# Patient Record
Sex: Female | Born: 1990 | Race: Black or African American | Hispanic: No | Marital: Married | Smoking: Current every day smoker
Health system: Southern US, Community
[De-identification: ages and names within clinical notes are randomized; demographics above are authoritative.]

---

## 2009-07-14 ENCOUNTER — Emergency Department: Payer: Self-pay | Admitting: Emergency Medicine

## 2013-03-23 ENCOUNTER — Emergency Department: Payer: Self-pay | Admitting: Emergency Medicine

## 2013-03-23 LAB — COMPREHENSIVE METABOLIC PANEL
Albumin: 3.7 g/dL (ref 3.4–5.0)
Anion Gap: 5 — ABNORMAL LOW (ref 7–16)
Bilirubin,Total: 0.5 mg/dL (ref 0.2–1.0)
Calcium, Total: 8.6 mg/dL (ref 8.5–10.1)
Creatinine: 0.75 mg/dL (ref 0.60–1.30)
EGFR (Non-African Amer.): >60
Osmolality: 276 (ref 275–301)
Potassium: 3.2 mmol/L — ABNORMAL LOW (ref 3.5–5.1)
SGOT(AST): 21 U/L (ref 15–37)
SGPT (ALT): 13 U/L (ref 12–78)

## 2013-03-23 LAB — URINALYSIS, COMPLETE
Bacteria: NONE SEEN
Bilirubin,UR: NEGATIVE
Blood: NEGATIVE
Glucose,UR: NEGATIVE mg/dL (ref 0–75)
Leukocyte Esterase: NEGATIVE
Nitrite: NEGATIVE
Ph: 6 (ref 4.5–8.0)
RBC,UR: 1 /HPF (ref 0–5)
Specific Gravity: 1.02 (ref 1.003–1.030)

## 2013-03-23 LAB — WET PREP, GENITAL

## 2013-03-23 LAB — CBC
MCV: 90 fL (ref 80–100)
Platelet: 172 10*3/uL (ref 150–440)
RBC: 3.94 10*6/uL (ref 3.80–5.20)
WBC: 6.7 10*3/uL (ref 3.6–11.0)

## 2013-03-23 LAB — LIPASE, BLOOD: Lipase: 74 U/L (ref 73–393)

## 2017-07-08 ENCOUNTER — Other Ambulatory Visit: Payer: Self-pay

## 2017-07-08 ENCOUNTER — Ambulatory Visit
Admission: EM | Admit: 2017-07-08 | Discharge: 2017-07-08 | Disposition: A | Discharge status: Home / Self Care | Payer: BLUE CROSS/BLUE SHIELD | Attending: Family Medicine | Admitting: Family Medicine

## 2017-07-08 ENCOUNTER — Encounter: Payer: Self-pay | Admitting: Emergency Medicine

## 2017-07-08 DIAGNOSIS — R059 Cough, unspecified: Secondary | ICD-10-CM

## 2017-07-08 DIAGNOSIS — R69 Illness, unspecified: Secondary | ICD-10-CM | POA: Diagnosis not present

## 2017-07-08 DIAGNOSIS — R05 Cough: Secondary | ICD-10-CM

## 2017-07-08 DIAGNOSIS — R51 Headache: Secondary | ICD-10-CM

## 2017-07-08 DIAGNOSIS — J029 Acute pharyngitis, unspecified: Secondary | ICD-10-CM

## 2017-07-08 DIAGNOSIS — J111 Influenza due to unidentified influenza virus with other respiratory manifestations: Secondary | ICD-10-CM

## 2017-07-08 LAB — RAPID STREP SCREEN (MED CTR MEBANE ONLY): Streptococcus, Group A Screen (Direct): NEGATIVE

## 2017-07-08 MED ORDER — OSELTAMIVIR PHOSPHATE 75 MG PO CAPS: 75.0000 mg | ORAL_CAPSULE | Freq: Two times a day (BID) | ORAL | 0 refills | Status: AC

## 2017-07-08 NOTE — Discharge Instructions (Addendum)
Recommend start Tamiflu 75mg  twice a day as directed. May continue OTC cold and flu medications as needed for symptoms. May also take OTC Delsym as directed for cough. Rest. Increase fluid intake to help loosen up mucus. Follow-up in 3 to 4 days here if not improving.

## 2017-07-08 NOTE — ED Provider Notes (Signed)
[facility]    CSN: [id number] Arrival date & time: 07/08/17  1121     History   Chief Complaint Chief Complaint  Patient presents with  . Sore Throat    HPI Ashlee Gonzales is a 27 y.o. female.   27 year old female presents with sore throat, cough, body aches and fatigue that started yesterday. Also having some nausea but denies any nasal congestion, vomiting or diarrhea. Has felt warm but no documented fever at home. She has tried Catering manager with some relief.  Daughter had similar symptoms and dx with the flu last week and took Tamiflu and felt much better. No other chronic health issues. Takes no daily medication.    The history is provided by the patient.    History reviewed. No pertinent past medical history.  There are no active problems to display for this patient.   History reviewed. No pertinent surgical history.  OB History    No data available       Home Medications    Prior to Admission medications   Medication Sig Start Date End Date Taking? Authorizing Provider  levonorgestrel (MIRENA) 20 MCG/24HR IUD 1 each by Intrauterine route once.   Yes [provider]  oseltamivir (TAMIFLU) 75 MG capsule Take 1 capsule (75 mg total) by mouth 2 (two) times daily for 5 days. 07/08/17 07/13/17  Sudie Grumbling, NP    Family History History reviewed. No pertinent family history.  Social History Social History   Tobacco Use  . Smoking status: Current Every Day Smoker    Types: E-cigarettes  . Smokeless tobacco: Never Used  Substance Use Topics  . Alcohol use: Yes  . Drug use: Not on file     Allergies   Patient has no known allergies.   Review of Systems Review of Systems  Constitutional: Positive for appetite change, chills and fatigue.  HENT: Positive for postnasal drip and sore throat. Negative for ear discharge, ear pain, facial swelling, mouth sores, nosebleeds, sinus pressure, sinus pain and sneezing.   Eyes:  Negative for pain, discharge, redness and itching.  Respiratory: Positive for cough. Negative for chest tightness, shortness of breath and wheezing.   Gastrointestinal: Positive for nausea. Negative for abdominal pain, diarrhea and vomiting.  Musculoskeletal: Positive for arthralgias and myalgias. Negative for neck pain and neck stiffness.  Skin: Negative for rash and wound.  Neurological: Positive for headaches. Negative for dizziness, tremors, seizures, syncope, weakness, light-headedness and numbness.  Hematological: Negative for adenopathy. Does not bruise/bleed easily.     Physical Exam Triage Vital Signs ED Triage Vitals  Enc Vitals Group     BP 07/08/17 1149 (!) 99/50     Pulse Rate 07/08/17 1149 72     Resp 07/08/17 1149 14     Temp 07/08/17 1149 98.6 F (37 C)     Temp Source 07/08/17 1149 Oral     SpO2 07/08/17 1149 100 %     Weight 07/08/17 1147 115 lb (52.2 kg)     Height 07/08/17 1147 5\' 3"  (1.6 m)     Head Circumference --      Peak Flow --      Pain Score 07/08/17 1147 6     Pain Loc --      Pain Edu? --      Excl. in GC? --    No data found.  Updated Vital Signs BP (!) 99/50 (BP Location: Left Arm)   Pulse 72   Temp  98.6 F (37 C) (Oral)   Resp 14   Ht 5\' 3"  (1.6 m)   Wt 115 lb (52.2 kg)   SpO2 100%   BMI 20.37 kg/m   Visual Acuity Right Eye Distance:   Left Eye Distance:   Bilateral Distance:    Right Eye Near:   Left Eye Near:    Bilateral Near:     Physical Exam  Constitutional: She is oriented to person, place, and time. She appears well-developed and well-nourished. She appears ill. No distress.  HENT:  Head: Normocephalic and atraumatic.  Right Ear: Hearing, tympanic membrane, external ear and ear canal normal.  Left Ear: Hearing, tympanic membrane, external ear and ear canal normal.  Nose: Nose normal. No mucosal edema or rhinorrhea. Right sinus exhibits no maxillary sinus tenderness and no frontal sinus tenderness. Left sinus exhibits  no maxillary sinus tenderness and no frontal sinus tenderness.  Mouth/Throat: Uvula is midline and mucous membranes are normal. Posterior oropharyngeal erythema present. Oropharyngeal exudate: slight clear post nasal drainage present.  Eyes: Conjunctivae and EOM are normal.  Neck: Normal range of motion. Neck supple.  Cardiovascular: Normal rate, regular rhythm and normal heart sounds.  No murmur heard. Pulmonary/Chest: Effort normal. No respiratory distress. She has decreased breath sounds in the right upper field, the right lower field, the left upper field and the left lower field. She has no wheezes. She has no rhonchi. She has no rales.  Musculoskeletal: Normal range of motion.  Lymphadenopathy:    She has no cervical adenopathy.  Neurological: She is alert and oriented to person, place, and time.  Skin: Skin is warm and dry. Capillary refill takes less than 2 seconds. No rash noted.  Psychiatric: She has a normal mood and affect. Her behavior is normal. Judgment and thought content normal.     UC Treatments / Results  Labs (all labs ordered are listed, but only abnormal results are displayed) Labs Reviewed  RAPID STREP SCREEN (NOT AT [facility])  CULTURE, GROUP A STREP [facility])    EKG  EKG Interpretation None       Radiology No results found.  Procedures Procedures (including critical care time)  Medications Ordered in UC Medications - No data to display   Initial Impression / Assessment and Plan / UC Course  I have reviewed the triage vital signs and the nursing notes.  Pertinent labs & imaging results that were available during my care of the patient were reviewed by me and considered in my medical decision making (see chart for details).    Reviewed negative rapid strep test with patient. Discussed low accuracy of rapid flu test - reviewed dx of influenza based on symptoms and local flu activity. Discussed that she probably has a viral illness and may be influenza.  Patient desires treatment with Tamiflu. May start Tamiflu 75mg  twice a day as directed. May continue OTC cold and flu medications as needed for symptoms. May also take OTC Delsym as directed for cough. Rest. Increase fluid intake to help loosen up mucus. Note written for work. Follow-up in 3 to 4 days here if not improving.    Final Clinical Impressions(s) / UC Diagnoses   Final diagnoses:  Influenza-like illness  Cough    ED Discharge Orders        Ordered    oseltamivir (TAMIFLU) 75 MG capsule  2 times daily     07/08/17 1302       Controlled Substance Prescriptions [place] Controlled Substance Registry consulted? Not Applicable  Sudie Grumbling, NP 07/08/17 2127

## 2017-07-08 NOTE — ED Triage Notes (Signed)
Patient c/o sore throat and cough that started this morning.  Patient states that her daughter had the flu last week.

## 2017-07-11 ENCOUNTER — Telehealth: Payer: Self-pay | Admitting: Emergency Medicine

## 2017-07-11 LAB — CULTURE, GROUP A STREP (THRC)

## 2017-07-11 NOTE — Telephone Encounter (Signed)
Patient notified of lab result Throat culture Negative.  Patient states that she is feeling better.  Patient verbalized understanding.

## 2017-09-17 ENCOUNTER — Other Ambulatory Visit: Payer: Self-pay

## 2017-09-17 ENCOUNTER — Encounter: Payer: Self-pay | Admitting: Emergency Medicine

## 2017-09-17 ENCOUNTER — Emergency Department
Admission: EM | Admit: 2017-09-17 | Discharge: 2017-09-17 | Disposition: A | Discharge status: Home / Self Care | Payer: BLUE CROSS/BLUE SHIELD | Attending: Student in an Organized Health Care Education/Training Program | Admitting: Student in an Organized Health Care Education/Training Program

## 2017-09-17 ENCOUNTER — Emergency Department: Payer: BLUE CROSS/BLUE SHIELD

## 2017-09-17 DIAGNOSIS — F17210 Nicotine dependence, cigarettes, uncomplicated: Secondary | ICD-10-CM | POA: Diagnosis not present

## 2017-09-17 DIAGNOSIS — R102 Pelvic and perineal pain: Secondary | ICD-10-CM | POA: Insufficient documentation

## 2017-09-17 DIAGNOSIS — B373 Candidiasis of vulva and vagina: Secondary | ICD-10-CM | POA: Insufficient documentation

## 2017-09-17 DIAGNOSIS — B3731 Acute candidiasis of vulva and vagina: Secondary | ICD-10-CM

## 2017-09-17 LAB — POCT PREGNANCY, URINE: Preg Test, Ur: NEGATIVE

## 2017-09-17 LAB — WET PREP, GENITAL
CLUE CELLS WET PREP: NONE SEEN
Sperm: NONE SEEN
Trich, Wet Prep: NONE SEEN

## 2017-09-17 LAB — URINALYSIS, COMPLETE (UACMP) WITH MICROSCOPIC
Bilirubin Urine: NEGATIVE
GLUCOSE, UA: NEGATIVE mg/dL
Ketones, ur: NEGATIVE mg/dL
NITRITE: NEGATIVE
PROTEIN: NEGATIVE mg/dL
SPECIFIC GRAVITY, URINE: 1.018 (ref 1.005–1.030)
pH: 6 (ref 5.0–8.0)

## 2017-09-17 MED ORDER — FLUCONAZOLE 150 MG PO TABS: 150.0000 mg | ORAL_TABLET | Freq: Every day | ORAL | 0 refills | Status: DC

## 2017-09-17 MED ORDER — FLUCONAZOLE 50 MG PO TABS
150.0000 mg | ORAL_TABLET | Freq: Once | ORAL | Status: AC
  Administered 2017-09-17: 150 mg via ORAL

## 2017-09-17 MED ORDER — FLUCONAZOLE 50 MG PO TABS
ORAL_TABLET | ORAL | Status: AC
  Filled 2017-09-17: qty 1

## 2017-09-17 MED ORDER — FLUCONAZOLE 100 MG PO TABS
ORAL_TABLET | ORAL | Status: AC
  Filled 2017-09-17: qty 1

## 2017-09-17 NOTE — ED Notes (Signed)
White Vaginal discharge x 10 days unresolved by yeast infection medication. Pelvic pain x 2 days.

## 2017-09-17 NOTE — ED Provider Notes (Signed)
[facility] Emergency Department Provider Note    First MD Initiated Contact with Patient 09/17/17 2101     (approximate)  I have reviewed the triage vital signs and the nursing notes.   HISTORY  Chief Complaint Pelvic Pain and Vaginal Discharge    HPI Ashlee Gonzales is a 27 y.o. female presents with chief complaint of vaginal discharge for the past 10days.  Patient states it is similar to previous yeast infections but did not get better with yeast infection medication.  States she started having left lower quadrant pelvic pain for the past 2 days.  Denies any chance of being pregnant.  Has IUD in place.  She is sexually active with one partner.  Denies any measured fevers.  No nausea or vomiting.  Pain is crampy in nature.  History reviewed. No pertinent past medical history. History reviewed. No pertinent family history. History reviewed. No pertinent surgical history. There are no active problems to display for this patient.     Prior to Admission medications   Medication Sig Start Date End Date Taking? Authorizing Provider  levonorgestrel (MIRENA) 20 MCG/24HR IUD 1 each by Intrauterine route once.    [provider]    Allergies Patient has no known allergies.    Social History Social History   Tobacco Use  . Smoking status: Current Every Day Smoker    Types: E-cigarettes  . Smokeless tobacco: Never Used  Substance Use Topics  . Alcohol use: Yes  . Drug use: Yes    Types: Marijuana    Review of Systems Patient denies headaches, rhinorrhea, blurry vision, numbness, shortness of breath, chest pain, edema, cough, abdominal pain, nausea, vomiting, diarrhea, dysuria, fevers, rashes or hallucinations unless otherwise stated above in HPI. ____________________________________________   PHYSICAL EXAM:  VITAL SIGNS: Vitals:   09/17/17 1937  BP: 102/60  Pulse: 67  Resp: 18  Temp: 98 F (36.7 C)  SpO2: 100%     Constitutional: Alert and oriented. Well appearing and in no acute distress. Eyes: Conjunctivae are normal.  Head: Atraumatic. Nose: No congestion/rhinnorhea. Mouth/Throat: Mucous membranes are moist.   Neck: No stridor. Painless ROM.  Cardiovascular: Normal rate, regular rhythm. Grossly normal heart sounds.  Good peripheral circulation. Respiratory: Normal respiratory effort.  No retractions. Lungs CTAB. Gastrointestinal: Soft and nontender. No distention. No abdominal bruits. No CVA tenderness. Genitourinary: deferred Musculoskeletal: No lower extremity tenderness nor edema.  No joint effusions. Neurologic:  Normal speech and language. No gross focal neurologic deficits are appreciated. No facial droop Skin:  Skin is warm, dry and intact. No rash noted. Psychiatric: Mood and affect are normal. Speech and behavior are normal.  ____________________________________________   LABS (all labs ordered are listed, but only abnormal results are displayed)  Results for orders placed or performed during the hospital encounter of 09/17/17 (from the past 24 hour(s))  Urinalysis, Complete w Microscopic     Status: Abnormal   Collection Time: 09/17/17  7:50 PM  Result Value Ref Range   Color, Urine YELLOW (A) YELLOW   APPearance CLEAR (A) CLEAR   Specific Gravity, Urine 1.018 1.005 - 1.030   pH 6.0 5.0 - 8.0   Glucose, UA NEGATIVE NEGATIVE mg/dL   Hgb urine dipstick SMALL (A) NEGATIVE   Bilirubin Urine NEGATIVE NEGATIVE   Ketones, ur NEGATIVE NEGATIVE mg/dL   Protein, ur NEGATIVE NEGATIVE mg/dL   Nitrite NEGATIVE NEGATIVE   Leukocytes, UA TRACE (A) NEGATIVE   RBC / HPF 0-5 0 - 5 RBC/hpf  WBC, UA 0-5 0 - 5 WBC/hpf   Bacteria, UA RARE (A) NONE SEEN   Squamous Epithelial / LPF 0-5 0 - 5   Mucus PRESENT   Pregnancy, urine POC     Status: None   Collection Time: 09/17/17  7:52 PM  Result Value Ref Range   Preg Test, Ur NEGATIVE NEGATIVE    ____________________________________________ ____________________________________________  RADIOLOGY  I personally reviewed all radiographic images ordered to evaluate for the above acute complaints and reviewed radiology reports and findings.  These findings were personally discussed with the patient.  Please see medical record for radiology report.  ____________________________________________   PROCEDURES  Procedure(s) performed:  Procedures    Critical Care performed: no ____________________________________________   INITIAL IMPRESSION / ASSESSMENT AND PLAN / ED COURSE  Pertinent labs & imaging results that were available during my care of the patient were reviewed by me and considered in my medical decision making (see chart for details).  DDX: vaginitis, candidiasis, bv, trich, pid, torsion, toa  Ashlee Gonzales is a 27 y.o. who presents to the ED with symptoms as described above.  Ultrasound ordered to exclude torsion or TOA.  Ultrasound fortunately shows no acute abnormality.  Discussed option for pelvic exam to further characterize versus self swab technique to evaluate for any infectious cause of her pain.  Patient has selected self swab and declined pelvic exam at this time.  Do feel this is appropriate as her abdominal exam is otherwise soft and benign.   Wet prep does show yeast.  As ultrasound is reassuring patient well-appearing in no acute distress will give dose of Diflucan as well as prescription for second dose in 72 hours if symptoms not improved.  Discussed signs and symptoms for which she should return immediately to the hospital.   As part of my medical decision making, I reviewed the following data within the electronic MEDICAL RECORD NUMBER Nursing notes reviewed and incorporated, Labs reviewed, notes from prior ED visits.   ____________________________________________   FINAL CLINICAL IMPRESSION(S) / ED DIAGNOSES  Final diagnoses:  Pelvic pain   Candidiasis of vagina      NEW MEDICATIONS STARTED DURING THIS VISIT:  New Prescriptions   No medications on file     Note:  This document was prepared using Dragon voice recognition software and may include unintentional dictation errors.    Willy Eddyobinson, Cing Gould, MD 09/17/17 [phone]

## 2017-09-17 NOTE — ED Triage Notes (Signed)
Pt presents to ED with pelvic discomfort/cramping that started 3 days ago and vaginal discharge that has been ongoing for about 10 days. No hx of the same. Pt states she has attempted to use otc medication for yeast infections with no relief.

## 2017-09-17 NOTE — Discharge Instructions (Addendum)

## 2017-09-18 LAB — CHLAMYDIA/NGC RT PCR (ARMC ONLY)
CHLAMYDIA TR: NOT DETECTED
N GONORRHOEAE: NOT DETECTED

## 2017-10-25 ENCOUNTER — Encounter: Payer: Self-pay | Admitting: Emergency Medicine

## 2017-10-25 ENCOUNTER — Emergency Department
Admission: EM | Admit: 2017-10-25 | Discharge: 2017-10-25 | Disposition: A | Discharge status: Home / Self Care | Payer: BLUE CROSS/BLUE SHIELD | Attending: Emergency Medicine | Admitting: Emergency Medicine

## 2017-10-25 ENCOUNTER — Other Ambulatory Visit: Payer: Self-pay

## 2017-10-25 DIAGNOSIS — Z3202 Encounter for pregnancy test, result negative: Secondary | ICD-10-CM | POA: Insufficient documentation

## 2017-10-25 DIAGNOSIS — N898 Other specified noninflammatory disorders of vagina: Secondary | ICD-10-CM | POA: Diagnosis present

## 2017-10-25 DIAGNOSIS — F17290 Nicotine dependence, other tobacco product, uncomplicated: Secondary | ICD-10-CM | POA: Diagnosis not present

## 2017-10-25 DIAGNOSIS — B3731 Acute candidiasis of vulva and vagina: Secondary | ICD-10-CM

## 2017-10-25 DIAGNOSIS — B373 Candidiasis of vulva and vagina: Secondary | ICD-10-CM | POA: Diagnosis not present

## 2017-10-25 LAB — URINALYSIS, COMPLETE (UACMP) WITH MICROSCOPIC
BILIRUBIN URINE: NEGATIVE
Bacteria, UA: NONE SEEN
GLUCOSE, UA: NEGATIVE mg/dL
HGB URINE DIPSTICK: NEGATIVE
Ketones, ur: 20 mg/dL — AB
NITRITE: NEGATIVE
PH: 7 (ref 5.0–8.0)
Protein, ur: NEGATIVE mg/dL
SPECIFIC GRAVITY, URINE: 1.023 (ref 1.005–1.030)

## 2017-10-25 LAB — POCT PREGNANCY, URINE: Preg Test, Ur: NEGATIVE

## 2017-10-25 MED ORDER — FLUCONAZOLE 150 MG PO TABS: 150.0000 mg | ORAL_TABLET | Freq: Every day | ORAL | 0 refills | Status: AC

## 2017-10-25 NOTE — ED Provider Notes (Signed)
[facility] Emergency Department Provider Note   ____________________________________________   First MD Initiated Contact with Patient 10/25/17 [id number]     (approximate)  I have reviewed the triage vital signs and the nursing notes.   HISTORY  Chief Complaint Vaginitis    HPI Ashlee Gonzales is a 27 y.o. female patient presents with a white vaginal discharge.  Patient has history of recurrent yeast infections.  Patient was seen in this facility last month and had yeast infection on wet prep.  Patient was advised [facility].  Patient states she does not have a GYN doctor.  Patient state no Pap smears or well woman exam in over 3 years.  Patient has [place] which she stated [place] [facility] [place].  Patient denies dysuria.  Patient complain of vaginal itching.  History reviewed. No pertinent past medical history.  There are no active problems to display for this patient.   History reviewed. No pertinent surgical history.  Prior to Admission medications   Medication Sig Start Date End Date Taking? Authorizing Provider  fluconazole (DIFLUCAN) 150 MG tablet Take 1 tablet (150 mg total) by mouth daily. Take on 4/26 if sypmtoms not improved 10/25/17   Joni ReiningSmith, Amillia Biffle K, PA-C  levonorgestrel [facility](MIRENA) 20 MCG/24HR IUD 1 each by Intrauterine route once.    [provider]    Allergies Patient has no known allergies.  History reviewed. No pertinent family history.  Social History Social History   Tobacco Use  . Smoking status: Current Every Day Smoker    Types: E-cigarettes  . Smokeless tobacco: Never Used  Substance Use Topics  . Alcohol use: Yes  . Drug use: Yes    Types: Marijuana    Review of Systems Constitutional: No fever/chills Eyes: No visual changes. ENT: No sore throat. Cardiovascular: Denies chest pain. Respiratory: Denies shortness of breath. Gastrointestinal: No abdominal  pain.  No nausea, no vomiting.  No diarrhea.  No constipation. Genitourinary: White vaginal discharge and itching. Musculoskeletal: Negative for back pain. Skin: Negative for rash. Neurological: Negative for headaches, focal weakness or numbness.   ____________________________________________   PHYSICAL EXAM:  VITAL SIGNS: ED Triage Vitals  Enc Vitals Group     BP 10/25/17 0855 (!) 104/52     Pulse Rate 10/25/17 0855 77     Resp 10/25/17 0855 16     Temp 10/25/17 0855 98.1 F (36.7 C)     Temp Source 10/25/17 0855 Oral     SpO2 10/25/17 0855 98 %     Weight 10/25/17 0856 115 lb (52.2 kg)     Height 10/25/17 0856 5\' 4"  (1.626 m)     Head Circumference --      Peak Flow --      Pain Score 10/25/17 0856 0     Pain Loc --      Pain Edu? --      Excl. in GC? --    Constitutional: Alert and oriented. Well appearing and in no acute distress. cal: No cervical lymphadenopathy. Cardiovascular: Normal rate, regular rhythm. Grossly normal heart sounds.  Good peripheral circulation. Respiratory: Normal respiratory effort.  No retractions. Lungs CTAB. Genitourinary: Deferred Neurologic:  Normal speech and language. No gross focal neurologic deficits are appreciated. No gait instability. Skin:  Skin is warm, dry and intact. No rash noted. Psychiatric: Mood and affect are normal. Speech and behavior are normal.  ____________________________________________   LABS (all labs ordered are listed,  but only abnormal results are displayed)  Labs Reviewed  URINALYSIS, COMPLETE (UACMP) WITH MICROSCOPIC - Abnormal; Notable for the following components:      Result Value   Color, Urine YELLOW (*)    APPearance CLEAR (*)    Ketones, ur 20 (*)    Leukocytes, UA SMALL (*)    All other components within normal limits  POC URINE PREG, ED  POCT PREGNANCY, URINE   ____________________________________________  EKG   ____________________________________________  RADIOLOGY  ED MD  interpretation:    Official radiology report(s): No results found.  ____________________________________________   PROCEDURES  Procedure(s) performed: None  Procedures  Critical Care performed: No  ____________________________________________   INITIAL IMPRESSION / ASSESSMENT AND PLAN / ED COURSE  As part of my medical decision making, I reviewed the following data within the electronic MEDICAL RECORD NUMBER    Vaginal itching irritation secondary to yeast infection.  Discussed lab results with patient.  Patient advised to follow-up with GYN clinic due to the recurrent nature of her complaint.  Take medication as directed.     ____________________________________________   FINAL CLINICAL IMPRESSION(S) / ED DIAGNOSES  Final diagnoses:  Vaginal candidiasis     ED Discharge Orders        Ordered    fluconazole (DIFLUCAN) 150 MG tablet  Daily     10/25/17 0951       Note:  This document was prepared using Dragon voice recognition software and may include unintentional dictation errors.    Joni Reining, PA-C 10/25/17 1610    Minna Antis, MD 10/25/17 1137

## 2017-10-25 NOTE — ED Triage Notes (Signed)
Here for yeast infection. White vaginal discharge. Hx of same. Does have IUD in.  Does not have OBGYN.  No other complaints. Vaginal irritation but no pain.

## 2017-10-25 NOTE — ED Notes (Signed)
See triage note  States she thinks she has another yeast infection  Has had 3 in past   Positive irritation  No pain

## 2018-05-13 ENCOUNTER — Other Ambulatory Visit: Payer: Self-pay

## 2018-05-13 ENCOUNTER — Emergency Department
Admission: EM | Admit: 2018-05-13 | Discharge: 2018-05-13 | Disposition: A | Discharge status: Home / Self Care | Payer: BLUE CROSS/BLUE SHIELD | Attending: Emergency Medicine | Admitting: Emergency Medicine

## 2018-05-13 DIAGNOSIS — Z3202 Encounter for pregnancy test, result negative: Secondary | ICD-10-CM | POA: Insufficient documentation

## 2018-05-13 DIAGNOSIS — R112 Nausea with vomiting, unspecified: Secondary | ICD-10-CM

## 2018-05-13 DIAGNOSIS — R059 Cough, unspecified: Secondary | ICD-10-CM

## 2018-05-13 DIAGNOSIS — Z79899 Other long term (current) drug therapy: Secondary | ICD-10-CM | POA: Insufficient documentation

## 2018-05-13 DIAGNOSIS — F17290 Nicotine dependence, other tobacco product, uncomplicated: Secondary | ICD-10-CM | POA: Insufficient documentation

## 2018-05-13 DIAGNOSIS — R05 Cough: Secondary | ICD-10-CM

## 2018-05-13 DIAGNOSIS — J101 Influenza due to other identified influenza virus with other respiratory manifestations: Secondary | ICD-10-CM | POA: Insufficient documentation

## 2018-05-13 LAB — URINALYSIS, ROUTINE W REFLEX MICROSCOPIC
BACTERIA UA: NONE SEEN
Bilirubin Urine: NEGATIVE
Ketones, ur: 80 mg/dL — AB
Leukocytes, UA: NEGATIVE
NITRITE: NEGATIVE
PROTEIN: NEGATIVE mg/dL
Specific Gravity, Urine: 1.025 (ref 1.005–1.030)
pH: 5 (ref 5.0–8.0)

## 2018-05-13 LAB — CBC
HEMATOCRIT: 39.9 % (ref 36.0–46.0)
Hemoglobin: 13 g/dL (ref 12.0–15.0)
MCH: 31.4 pg (ref 26.0–34.0)
MCHC: 32.6 g/dL (ref 30.0–36.0)
MCV: 96.4 fL (ref 80.0–100.0)
Platelets: 154 10*3/uL (ref 150–400)
RBC: 4.14 MIL/uL (ref 3.87–5.11)
RDW: 11.8 % (ref 11.5–15.5)
WBC: 3.3 10*3/uL — AB (ref 4.0–10.5)
nRBC: 0 % (ref 0.0–0.2)

## 2018-05-13 LAB — BASIC METABOLIC PANEL
Anion gap: 8 (ref 5–15)
BUN: 11 mg/dL (ref 6–20)
CALCIUM: 8.4 mg/dL — AB (ref 8.9–10.3)
CO2: 23 mmol/L (ref 22–32)
Chloride: 103 mmol/L (ref 98–111)
Creatinine, Ser: 0.81 mg/dL (ref 0.44–1.00)
GFR calc non Af Amer: >60 mL/min (ref >60)
Glucose, Bld: 224 mg/dL — ABNORMAL HIGH (ref 70–99)
Potassium: 3.3 mmol/L — ABNORMAL LOW (ref 3.5–5.1)
Sodium: 134 mmol/L — ABNORMAL LOW (ref 135–145)

## 2018-05-13 LAB — HEPATIC FUNCTION PANEL
ALBUMIN: 4.1 g/dL (ref 3.5–5.0)
ALT: 24 U/L (ref 0–44)
AST: 36 U/L (ref 15–41)
Alkaline Phosphatase: 53 U/L (ref 38–126)
Bilirubin, Direct: <0.1 mg/dL (ref 0.0–0.2)
TOTAL PROTEIN: 6.7 g/dL (ref 6.5–8.1)
Total Bilirubin: 0.3 mg/dL (ref 0.3–1.2)

## 2018-05-13 LAB — INFLUENZA PANEL BY PCR (TYPE A & B)
INFLBPCR: NEGATIVE
Influenza A By PCR: POSITIVE — AB

## 2018-05-13 LAB — LIPASE, BLOOD: LIPASE: 28 U/L (ref 11–51)

## 2018-05-13 LAB — POCT PREGNANCY, URINE: PREG TEST UR: NEGATIVE

## 2018-05-13 MED ORDER — BENZONATATE 100 MG PO CAPS: 100.0000 mg | ORAL_CAPSULE | Freq: Four times a day (QID) | ORAL | 0 refills | Status: AC | PRN

## 2018-05-13 MED ORDER — ONDANSETRON HCL 4 MG/2ML IJ SOLN
INTRAMUSCULAR | Status: AC
  Filled 2018-05-13: qty 2

## 2018-05-13 MED ORDER — ONDANSETRON HCL 4 MG PO TABS: 4.0000 mg | ORAL_TABLET | Freq: Three times a day (TID) | ORAL | 0 refills | Status: AC | PRN

## 2018-05-13 MED ORDER — ONDANSETRON HCL 4 MG/2ML IJ SOLN
4.0000 mg | Freq: Once | INTRAMUSCULAR | Status: AC
  Administered 2018-05-13: 4 mg via INTRAVENOUS

## 2018-05-13 MED ORDER — SODIUM CHLORIDE 0.9 % IV BOLUS
1000.0000 mL | Freq: Once | INTRAVENOUS | Status: AC
Start: 2018-05-13 — End: 2018-05-13
  Administered 2018-05-13: 1000 mL via INTRAVENOUS

## 2018-05-13 NOTE — ED Provider Notes (Signed)
[facility] Emergency Department Provider Note   ____________________________________________   I have reviewed the triage vital signs and the nursing notes.   HISTORY  Chief Complaint Emesis   History limited by: Not Limited   HPI Ashlee Gonzales is a 27 y.o. female who presents to the emergency department today with concern for nausea and vomiting.  She states the nausea vomiting started today.  She has been unable to keep any fluids or food down.  She states for the past few days however she has had a cough.  This is been a dry cough.  She has had some discomfort in her chest with the cough.  She feels like she coughs to the point where she is making herself nauseous today.  She denies any fevers.   Per medical record review patient has a history of e cigarette use.   History reviewed. No pertinent past medical history.  There are no active problems to display for this patient.   History reviewed. No pertinent surgical history.  Prior to Admission medications   Medication Sig Start Date End Date Taking? Authorizing Provider  fluconazole (DIFLUCAN) 150 MG tablet Take 1 tablet (150 mg total) by mouth daily. Take on 4/26 if sypmtoms not improved 10/25/17   Joni ReiningSmith, Ronald K, PA-C  levonorgestrel [facility](MIRENA) 20 MCG/24HR IUD 1 each by Intrauterine route once.    [provider]    Allergies Patient has no known allergies.  History reviewed. No pertinent family history.  Social History Social History   Tobacco Use  . Smoking status: Current Every Day Smoker    Types: E-cigarettes  . Smokeless tobacco: Never Used  Substance Use Topics  . Alcohol use: Yes  . Drug use: Yes    Types: Marijuana    Review of Systems Constitutional: No fever/chills. Positive for generalized weakness.  Eyes: No visual changes. ENT: No sore throat. Cardiovascular: Positive for chest pain. Respiratory: Positive for cough Gastrointestinal: No abdominal  pain.  Positive for nausea and emesis.  Genitourinary: Negative for dysuria. Musculoskeletal: Negative for back pain. Skin: Negative for rash. Neurological: Negative for headaches, focal weakness or numbness.  ____________________________________________   PHYSICAL EXAM:  VITAL SIGNS: ED Triage Vitals [05/13/18 1403]  Enc Vitals Group     BP (!) 102/42     Pulse Rate 77     Resp 18     Temp 98.2 F (36.8 C)     Temp Source Oral     SpO2 99 %     Weight 120 lb (54.4 kg)     Height 5\' 4"  (1.626 m)     Head Circumference      Peak Flow      Pain Score 3   Constitutional: Alert and oriented.  Eyes: Conjunctivae are normal.  ENT      Head: Normocephalic and atraumatic.      Nose: No congestion/rhinnorhea.      Mouth/Throat: Mucous membranes are moist.      Neck: No stridor. Hematological/Lymphatic/Immunilogical: No cervical lymphadenopathy. Cardiovascular: Normal rate, regular rhythm.  No murmurs, rubs, or gallops. Respiratory: Normal respiratory effort without tachypnea nor retractions. Breath sounds are clear and equal bilaterally. No wheezes/rales/rhonchi. Gastrointestinal: Soft and non tender. No rebound. No guarding.  Genitourinary: Deferred Musculoskeletal: Normal range of motion in all extremities. No lower extremity edema. Neurologic:  Normal speech and language. No gross focal neurologic deficits are appreciated.  Skin:  Skin is warm, dry and intact. No rash noted. Psychiatric: Mood and  affect are normal. Speech and behavior are normal. Patient exhibits appropriate insight and judgment.  ____________________________________________    LABS (pertinent positives/negatives)  Influenza A positive Upreg negative BMP na 134, k 3.3, glu 224, cr 0.81 CBC wbc 3.3, hgb 13.0, plt 154 UA small hgb dipstick, 80 ketones, 0-5 rbc and wbc ____________________________________________   EKG  None  ____________________________________________     RADIOLOGY  None  ____________________________________________   PROCEDURES  Procedures  ____________________________________________   INITIAL IMPRESSION / ASSESSMENT AND PLAN / ED COURSE  Pertinent labs & imaging results that were available during my care of the patient were reviewed by me and considered in my medical decision making (see chart for details).   To department with a couple day history of cough as well as some nausea vomiting today.  Patient's influenza was positive.  I discussed this with the patient.  Do think this likely explains patient's symptoms.  She was given IV fluids as well as nausea medication in the emergency department.  She stated she felt better after the IV fluids.  She was able to tolerate p.o. after the nausea medication.  Discussed symptomatic care with patient.  Will discharge with cough medication and nausea medication.  ____________________________________________   FINAL CLINICAL IMPRESSION(S) / ED DIAGNOSES  Final diagnoses:  Influenza A  Nausea and vomiting, intractability of vomiting not specified, unspecified vomiting type  Cough     Note: This dictation was prepared with Dragon dictation. Any transcriptional errors that result from this process are unintentional     Phineas Semen, MD 05/13/18 1712

## 2018-05-13 NOTE — ED Triage Notes (Addendum)
Pt arrived via pov with emesis that started this morning with a cough and chest pressure that started Sat. No diarrhea.

## 2018-05-13 NOTE — Discharge Instructions (Addendum)
Please seek medical attention for any high fevers, chest pain, shortness of breath, change in behavior, persistent vomiting, bloody stool or any other new or concerning symptoms.  

## 2021-04-19 ENCOUNTER — Other Ambulatory Visit: Payer: Self-pay

## 2021-04-19 ENCOUNTER — Emergency Department: Payer: 59

## 2021-04-19 ENCOUNTER — Encounter: Payer: Self-pay | Admitting: Emergency Medicine

## 2021-04-19 DIAGNOSIS — F17210 Nicotine dependence, cigarettes, uncomplicated: Secondary | ICD-10-CM | POA: Diagnosis not present

## 2021-04-19 DIAGNOSIS — R0789 Other chest pain: Secondary | ICD-10-CM | POA: Diagnosis present

## 2021-04-19 DIAGNOSIS — Z20822 Contact with and (suspected) exposure to covid-19: Secondary | ICD-10-CM | POA: Insufficient documentation

## 2021-04-19 DIAGNOSIS — J40 Bronchitis, not specified as acute or chronic: Secondary | ICD-10-CM | POA: Diagnosis not present

## 2021-04-19 DIAGNOSIS — E876 Hypokalemia: Secondary | ICD-10-CM | POA: Diagnosis not present

## 2021-04-19 LAB — BASIC METABOLIC PANEL
Anion gap: 7 (ref 5–15)
BUN: 13 mg/dL (ref 6–20)
CO2: 27 mmol/L (ref 22–32)
Calcium: 9 mg/dL (ref 8.9–10.3)
Chloride: 104 mmol/L (ref 98–111)
Creatinine, Ser: 0.78 mg/dL (ref 0.44–1.00)
GFR, Estimated: >60 mL/min (ref >60)
Glucose, Bld: 73 mg/dL (ref 70–99)
Potassium: 3.3 mmol/L — ABNORMAL LOW (ref 3.5–5.1)
Sodium: 138 mmol/L (ref 135–145)

## 2021-04-19 LAB — CBC
HCT: 38.3 % (ref 36.0–46.0)
Hemoglobin: 12.9 g/dL (ref 12.0–15.0)
MCH: 32.2 pg (ref 26.0–34.0)
MCHC: 33.7 g/dL (ref 30.0–36.0)
MCV: 95.5 fL (ref 80.0–100.0)
Platelets: 222 10*3/uL (ref 150–400)
RBC: 4.01 MIL/uL (ref 3.87–5.11)
RDW: 11.4 % — ABNORMAL LOW (ref 11.5–15.5)
WBC: 7 10*3/uL (ref 4.0–10.5)
nRBC: 0 % (ref 0.0–0.2)

## 2021-04-19 LAB — TROPONIN I (HIGH SENSITIVITY): Troponin I (High Sensitivity): <2 ng/L (ref <18)

## 2021-04-19 NOTE — ED Triage Notes (Signed)
Pt to ED from home c/o chest pain, back pain, non productive cough x4 hours.  Denies n/v/d or fevers, denies urinary changes.  Pt A&Ox4, chest rise even and unlabored skin WNL and in NAD at this time.

## 2021-04-20 ENCOUNTER — Emergency Department
Admission: EM | Admit: 2021-04-20 | Discharge: 2021-04-20 | Disposition: A | Discharge status: Home / Self Care | Payer: 59 | Attending: Emergency Medicine | Admitting: Emergency Medicine

## 2021-04-20 DIAGNOSIS — J40 Bronchitis, not specified as acute or chronic: Secondary | ICD-10-CM

## 2021-04-20 DIAGNOSIS — E876 Hypokalemia: Secondary | ICD-10-CM

## 2021-04-20 LAB — RESP PANEL BY RT-PCR (FLU A&B, COVID) ARPGX2
Influenza A by PCR: NEGATIVE
Influenza B by PCR: NEGATIVE
SARS Coronavirus 2 by RT PCR: NEGATIVE

## 2021-04-20 LAB — HEPATIC FUNCTION PANEL
ALT: 18 U/L (ref 0–44)
AST: 23 U/L (ref 15–41)
Albumin: 3.9 g/dL (ref 3.5–5.0)
Alkaline Phosphatase: 78 U/L (ref 38–126)
Bilirubin, Direct: <0.1 mg/dL (ref 0.0–0.2)
Total Bilirubin: 0.5 mg/dL (ref 0.3–1.2)
Total Protein: 7 g/dL (ref 6.5–8.1)

## 2021-04-20 LAB — TROPONIN I (HIGH SENSITIVITY): Troponin I (High Sensitivity): 2 ng/L (ref <18)

## 2021-04-20 LAB — LIPASE, BLOOD: Lipase: 30 U/L (ref 11–51)

## 2021-04-20 MED ORDER — POTASSIUM CHLORIDE CRYS ER 20 MEQ PO TBCR
40.0000 meq | EXTENDED_RELEASE_TABLET | Freq: Once | ORAL | Status: AC
  Administered 2021-04-20: 40 meq via ORAL
  Filled 2021-04-20: qty 2

## 2021-04-20 MED ORDER — ACETAMINOPHEN 500 MG PO TABS
1000.0000 mg | ORAL_TABLET | Freq: Once | ORAL | Status: AC
  Administered 2021-04-20: 1000 mg via ORAL
  Filled 2021-04-20: qty 2

## 2021-04-20 MED ORDER — BENZONATATE 100 MG PO CAPS: 100.0000 mg | ORAL_CAPSULE | Freq: Three times a day (TID) | ORAL | 0 refills | Status: AC | PRN

## 2021-04-20 MED ORDER — IBUPROFEN 400 MG PO TABS
400.0000 mg | ORAL_TABLET | Freq: Once | ORAL | Status: AC
  Administered 2021-04-20: 400 mg via ORAL
  Filled 2021-04-20: qty 1

## 2021-04-20 NOTE — ED Provider Notes (Signed)
Emergency Medicine Provider Triage Evaluation Note  Ashlee Gonzales , a 30 y.o. female  was evaluated in triage.  Pt complains of chest pain, back pain, nonproductive cough.  Review of Systems  Positive: Chest pain, back pain, cough Negative: Shortness of breath, nausea or vomiting  Physical Exam  BP (!) 115/56 (BP Location: Left Arm)   Pulse 65   Temp 97.8 F (36.6 C) (Oral)   Resp 16   Ht 5\' 4"  (1.626 m)   Wt 59 kg   SpO2 100%   BMI 22.31 kg/m  Gen:   Awake, no distress   Resp:  Normal effort  MSK:   Moves extremities without difficulty  Other:    Medical Decision Making  Medically screening exam initiated at 2:22 AM.  Appropriate orders placed.  Ashlee Gonzales was informed that the remainder of the evaluation will be completed by another provider, this initial triage assessment does not replace that evaluation, and the importance of remaining in the ED until their evaluation is complete.  30 year old female presenting with chest pain, back pain and cough.  Will obtain cardiac panel, chest x-ray, respiratory panel while patient is awaiting a treatment room.   38, MD 04/20/21 [phone]

## 2021-04-20 NOTE — ED Notes (Signed)
AAOx3.  Skin warm and dry.  NAD 

## 2021-04-20 NOTE — ED Provider Notes (Signed)
[facility] Emergency Department Provider Note  ____________________________________________   Event Date/Time   First MD Initiated Contact with Patient 04/20/21 0740     (approximate)  I have reviewed the triage vital signs and the nursing notes.   HISTORY  Chief Complaint Chest Pain   HPI Ashlee Gonzales is a 30 y.o. female without significant past medical history who presents for assessment of approximately 2 days of cough, chest tightness with coughing and sore throat and congestion.  No earache, fevers, hemoptysis, shortness of breath, abdominal pain, nausea, vomiting, diarrhea, burning with urination, back pain, rash or extremity pain.  No recent falls or injuries.  Patient denies EtOH use, illicit drug use or tobacco abuse.  No history of DVT/PE.  Patient has Mirena IUD in place.  No other acute concerns at this time.         History reviewed. No pertinent past medical history.  There are no problems to display for this patient.   History reviewed. No pertinent surgical history.  Prior to Admission medications   Medication Sig Start Date End Date Taking? Authorizing Provider  benzonatate (TESSALON PERLES) 100 MG capsule Take 1 capsule (100 mg total) by mouth 3 (three) times daily as needed for up to 5 days for cough. 04/20/21 04/25/21 Yes Gilles Chiquito, MD  fluconazole (DIFLUCAN) 150 MG tablet Take 1 tablet (150 mg total) by mouth daily. Take on 4/26 if sypmtoms not improved 10/25/17   Joni Reining, PA-C  levonorgestrel [facility]) 20 MCG/24HR IUD 1 each by Intrauterine route once.    [provider]  ondansetron (ZOFRAN) 4 MG tablet Take 1 tablet (4 mg total) by mouth every 8 (eight) hours as needed for nausea or vomiting. 05/13/18   Phineas Semen, MD    Allergies Patient has no known allergies.  History reviewed. No pertinent family history.  Social History Social History   Tobacco Use   Smoking status: Every Day     Types: E-cigarettes   Smokeless tobacco: Never  Vaping Use   Vaping Use: Every day  Substance Use Topics   Alcohol use: Yes    Comment: 3 x per week   Drug use: Yes    Types: Marijuana    Review of Systems  Review of Systems  Constitutional:  Positive for malaise/fatigue. Negative for chills and fever.  HENT:  Positive for congestion and sore throat.   Eyes:  Negative for pain.  Respiratory:  Positive for cough. Negative for stridor.   Cardiovascular:  Positive for chest pain.  Gastrointestinal:  Negative for vomiting.  Genitourinary:  Negative for dysuria.  Musculoskeletal:  Negative for myalgias.  Skin:  Negative for rash.  Neurological:  Negative for seizures, loss of consciousness and headaches.  Psychiatric/Behavioral:  Negative for suicidal ideas.   All other systems reviewed and are negative.    ____________________________________________   PHYSICAL EXAM:  VITAL SIGNS: ED Triage Vitals  Enc Vitals Group     BP 04/19/21 2314 99/60     Pulse Rate 04/19/21 2314 70     Resp 04/19/21 2314 16     Temp 04/19/21 2314 97.8 F (36.6 C)     Temp Source 04/19/21 2314 Oral     SpO2 04/19/21 2314 100 %     Weight 04/19/21 2314 130 lb (59 kg)     Height 04/19/21 2314 5\' 4"  (1.626 m)     Head Circumference --      Peak Flow --  Pain Score 04/19/21 2319 2     Pain Loc --      Pain Edu? --      Excl. in GC? --    Vitals:   04/20/21 0413 04/20/21 0758  BP: 104/72 101/66  Pulse: 65 62  Resp: 16 15  Temp: 98.3 F (36.8 C)   SpO2: 100% 99%   Physical Exam Vitals and nursing note reviewed.  Constitutional:      General: She is not in acute distress.    Appearance: She is well-developed.  HENT:     Head: Normocephalic and atraumatic.     Right Ear: External ear normal.     Left Ear: External ear normal.     Nose: Nose normal.     Mouth/Throat:     Mouth: Mucous membranes are moist.     Pharynx: Posterior oropharyngeal erythema present. No oropharyngeal  exudate.  Eyes:     Conjunctiva/sclera: Conjunctivae normal.  Cardiovascular:     Rate and Rhythm: Normal rate and regular rhythm.     Heart sounds: No murmur heard. Pulmonary:     Effort: Pulmonary effort is normal. No respiratory distress.     Breath sounds: Normal breath sounds.  Abdominal:     Palpations: Abdomen is soft.     Tenderness: There is no abdominal tenderness.  Musculoskeletal:        General: No swelling.     Cervical back: Neck supple.  Skin:    General: Skin is warm and dry.     Capillary Refill: Capillary refill takes less than 2 seconds.  Neurological:     Mental Status: She is alert and oriented to person, place, and time.  Psychiatric:        Mood and Affect: Mood normal.     ____________________________________________   LABS (all labs ordered are listed, but only abnormal results are displayed)  Labs Reviewed  BASIC METABOLIC PANEL - Abnormal; Notable for the following components:      Result Value   Potassium 3.3 (*)    All other components within normal limits  CBC - Abnormal; Notable for the following components:   RDW 11.4 (*)    All other components within normal limits  RESP PANEL BY RT-PCR (FLU A&B, COVID) ARPGX2  HEPATIC FUNCTION PANEL  LIPASE, BLOOD  POC URINE PREG, ED  TROPONIN I (HIGH SENSITIVITY)  TROPONIN I (HIGH SENSITIVITY)   ____________________________________________  EKG  ECG remarkable for sinus rhythm without evidence of acute ischemia or significant arrhythmia.  Ventricular rate of 69. ____________________________________________  RADIOLOGY  ED MD interpretation: Chest x-ray shows no focal consolidation, effusion, edema, pneumothorax or other acute thoracic process.  Official radiology report(s): DG Chest 2 View  Result Date: 04/19/2021 CLINICAL DATA:  Chest pain cough EXAM: CHEST - 2 VIEW COMPARISON:  None. FINDINGS: No consolidation. No pleural effusion. Normal cardiomediastinal silhouette. No pneumothorax  IMPRESSION: No active cardiopulmonary disease. Electronically Signed   By: Jasmine Pang M.D.   On: 04/19/2021 23:34    ____________________________________________   PROCEDURES  Procedure(s) performed (including Critical Care):  Procedures   ____________________________________________   INITIAL IMPRESSION / ASSESSMENT AND PLAN / ED COURSE      Patient presents with above to history exam for several days of some cough, congestion, sore throat and some chest tightness.  On arrival patient is afebrile and hemodynamically stable.  She has a mild posterior pharyngeal erythema but otherwise unremarkable oropharynx without evidence of deep space infection of the head or neck.  Her lungs are clear bilaterally and she has no evidence of respiratory distress.  ECG and nonelevated troponin x2 not suggestive of ACS or myocarditis.  BC without leukocytosis or acute anemia.  Given absence of fever, leukocytosis or focal consolidation on chest x-ray have a lower suspicion for bacterial pneumonia at this time.  COVID influenza PCR is negative.  Lipase not consistent with atypical presentation of pancreatitis and hepatic function panel is unremarkable.  BMP remarkable for K of 3.3 without any other significant electrolyte metabolic derangements.  Impression is likely viral bronchitis.  Advised patient that I were unable to rule out a pulmonary embolism with complete certainty I will have a lower suspicion at this time.  Advised that the neck step would be to obtain a D-dimer and if this was elevated a CTA chest.  Advised that this can be deadly disease process but patient has a strong preference at this time to trial ibuprofen and Tylenol which he has not had any today and return if she is feeling any worse.  She is voices strong preference to avoid any unnecessary radiation if possible given she is in no respiratory distress with stable vitals and lower suspicion for PE at this time I think this is  reasonable.  Discharged in stable condition.  Strict return precautions advised and discussed.  Specifically emphasized returning immediately if she experiences any change in her symptoms or new shortness of breath.        ____________________________________________   FINAL CLINICAL IMPRESSION(S) / ED DIAGNOSES  Final diagnoses:  Bronchitis  Hypokalemia    Medications  acetaminophen (TYLENOL) tablet 1,000 mg (1,000 mg Oral Given 04/20/21 0822)  ibuprofen (ADVIL) tablet 400 mg (400 mg Oral Given 04/20/21 0822)  potassium chloride SA (KLOR-CON) CR tablet 40 mEq (40 mEq Oral Given 04/20/21 5329)     ED Discharge Orders          Ordered    benzonatate (TESSALON PERLES) 100 MG capsule  3 times daily PRN        04/20/21 0841             Note:  This document was prepared using Dragon voice recognition software and may include unintentional dictation errors.    Gilles Chiquito, MD 04/20/21 [phone]

## 2021-04-20 NOTE — ED Notes (Signed)
AAOx3.  Skin warm and dry.  C/O non productive cough and sore throat in the mornings x 2 days.  States thoracic back pain x 1 day with cough.

## 2021-07-12 ENCOUNTER — Other Ambulatory Visit: Payer: Self-pay

## 2021-07-12 ENCOUNTER — Ambulatory Visit
Admission: EM | Admit: 2021-07-12 | Discharge: 2021-07-12 | Disposition: A | Discharge status: Home / Self Care | Payer: 59 | Attending: Emergency Medicine | Admitting: Emergency Medicine

## 2021-07-12 DIAGNOSIS — J020 Streptococcal pharyngitis: Secondary | ICD-10-CM | POA: Insufficient documentation

## 2021-07-12 LAB — GROUP A STREP BY PCR: Group A Strep by PCR: DETECTED — AB

## 2021-07-12 MED ORDER — AMOXICILLIN-POT CLAVULANATE 875-125 MG PO TABS: 1.0000 | ORAL_TABLET | Freq: Two times a day (BID) | ORAL | 0 refills | Status: AC

## 2021-07-12 NOTE — Discharge Instructions (Addendum)
Take Tylenol or Motrin as needed for pain or fever Avoid any sports contact for least 7 days Your strep test was positive you are highly contagious Take full dose of medications with food Stay hydrated drink plenty of clear fluids

## 2021-07-12 NOTE — ED Provider Notes (Signed)
[facility]    CSN: [id number] Arrival date & time: 07/12/21  0932      History   Chief Complaint Chief Complaint  Patient presents with   Sore Throat    HPI Ashlee Gonzales is a 31 y.o. female.   Patient states that she has a sore throat for the past week.  At first had some abdominal pain and headache does not currently have this.  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  She took amount 1 amoxicillin for this and the white spots seem to resolve but the pain is still there.  Patient's husband has the same similar symptoms.  Denies any nausea vomiting or diarrhea.   History reviewed. No pertinent past medical history.  There are no problems to display for this patient.   History reviewed. No pertinent surgical history.  OB History   No obstetric history on file.      Home Medications    Prior to Admission medications   Medication Sig Start Date End Date Taking? Authorizing Provider  amoxicillin-clavulanate (AUGMENTIN) 875-125 MG tablet Take 1 tablet by mouth every 12 (twelve) hours. 07/12/21  Yes Marney Setting, NP  levonorgestrel (MIRENA) 20 MCG/24HR IUD 1 each by Intrauterine route once.   Yes [provider]  fluconazole (DIFLUCAN) 150 MG tablet Take 1 tablet (150 mg total) by mouth daily. Take on 4/26 if sypmtoms not improved 10/25/17   Sable Feil, PA-C  ondansetron (ZOFRAN) 4 MG tablet Take 1 tablet (4 mg total) by mouth every 8 (eight) hours as needed for nausea or vomiting. 05/13/18   Nance Pear, MD    Family History History reviewed. No pertinent family history.  Social History Social History   Tobacco Use   Smoking status: Every Day    Types: E-cigarettes   Smokeless tobacco: Never  Vaping Use   Vaping Use: Every day  Substance Use Topics   Alcohol use: Yes    Comment: 3 x per week   Drug use: Yes    Types: Marijuana     Allergies   Patient has no known allergies.   Review of  Systems Review of Systems  Constitutional:  Positive for fatigue. Negative for chills and fever.  HENT:  Positive for sore throat. Negative for congestion, nosebleeds, postnasal drip, rhinorrhea, sinus pressure, sinus pain and sneezing.   Respiratory: Negative.    Cardiovascular: Negative.   Gastrointestinal: Negative.   Genitourinary: Negative.   Skin:  Negative for rash.  Neurological: Negative.     Physical Exam Triage Vital Signs ED Triage Vitals  Enc Vitals Group     BP 07/12/21 1041 105/70     Pulse Rate 07/12/21 1041 69     Resp 07/12/21 1041 18     Temp 07/12/21 1041 98.5 F (36.9 C)     Temp Source 07/12/21 1041 Oral     SpO2 07/12/21 1041 99 %     Weight 07/12/21 1040 130 lb (59 kg)     Height 07/12/21 1040 5\' 4"  (1.626 m)     Head Circumference --      Peak Flow --      Pain Score 07/12/21 1040 4     Pain Loc --      Pain Edu? --      Excl. in [place]? --    No data found.  Updated Vital Signs BP 105/70 (BP Location: Right Arm)    Pulse 69  Temp 98.5 F (36.9 C) (Oral)    Resp 18    Ht 5\' 4"  (1.626 m)    Wt 130 lb (59 kg)    SpO2 99%    BMI 22.31 kg/m   Visual Acuity Right Eye Distance:   Left Eye Distance:   Bilateral Distance:    Right Eye Near:   Left Eye Near:    Bilateral Near:     Physical Exam Constitutional:      Appearance: She is well-developed and normal weight.  HENT:     Right Ear: Tympanic membrane normal.     Left Ear: Tympanic membrane normal.     Nose: No congestion.     Mouth/Throat:     Pharynx: Pharyngeal swelling, oropharyngeal exudate, posterior oropharyngeal erythema and uvula swelling present.     Tonsils: Tonsillar exudate present. No tonsillar abscesses. 1+ on the right. 1+ on the left.  Eyes:     Conjunctiva/sclera: Conjunctivae normal.  Cardiovascular:     Rate and Rhythm: Normal rate.  Pulmonary:     Effort: Pulmonary effort is normal.  Abdominal:     Palpations: Abdomen is soft.  Musculoskeletal:     Cervical  back: Normal range of motion.  Skin:    General: Skin is warm.  Neurological:     Mental Status: She is alert.     UC Treatments / Results  Labs (all labs ordered are listed, but only abnormal results are displayed) Labs Reviewed  GROUP A STREP BY PCR - Abnormal; Notable for the following components:      Result Value   Group A Strep by PCR DETECTED (*)    All other components within normal limits    EKG   Radiology No results found.  Procedures Procedures (including critical care time)  Medications Ordered in UC Medications - No data to display  Initial Impression / Assessment and Plan / UC Course  I have reviewed the triage vital signs and the nursing notes.  Pertinent labs & imaging results that were available during my care of the patient were reviewed by me and considered in my medical decision making (see chart for details).    Take Tylenol or Motrin as needed for pain or fever Avoid any sports contact for least 7 days Your strep test was positive you are highly contagious Take full dose of medications with food Stay hydrated drink plenty of clear fluids  Final Clinical Impressions(s) / UC Diagnoses   Final diagnoses:  Strep pharyngitis     Discharge Instructions      Take Tylenol or Motrin as needed for pain or fever Avoid any sports contact for least 7 days Your strep test was positive you are highly contagious Take full dose of medications with food Stay hydrated drink plenty of clear fluids    ED Prescriptions     Medication Sig Dispense Auth. Provider   amoxicillin-clavulanate (AUGMENTIN) 875-125 MG tablet Take 1 tablet by mouth every 12 (twelve) hours. 14 tablet Marney Setting, NP      PDMP not reviewed this encounter.   Marney Setting, NP 07/12/21 1132

## 2021-07-12 NOTE — ED Triage Notes (Signed)
Pt here with C/O tonsil swelling, has had some drainage for 1 week. Pt started having a sore throat. Pt took 2 left over antibiotics Monday and Tuesday and states the white patches are gone but throat still hurts.

## 2022-10-13 IMAGING — CR DG CHEST 2V
2 series · 2 of 2 positions shown · non-contrast
Comparison: None.

CLINICAL DATA: Chest pain cough

EXAM:
CHEST - 2 VIEW

[chest pa]
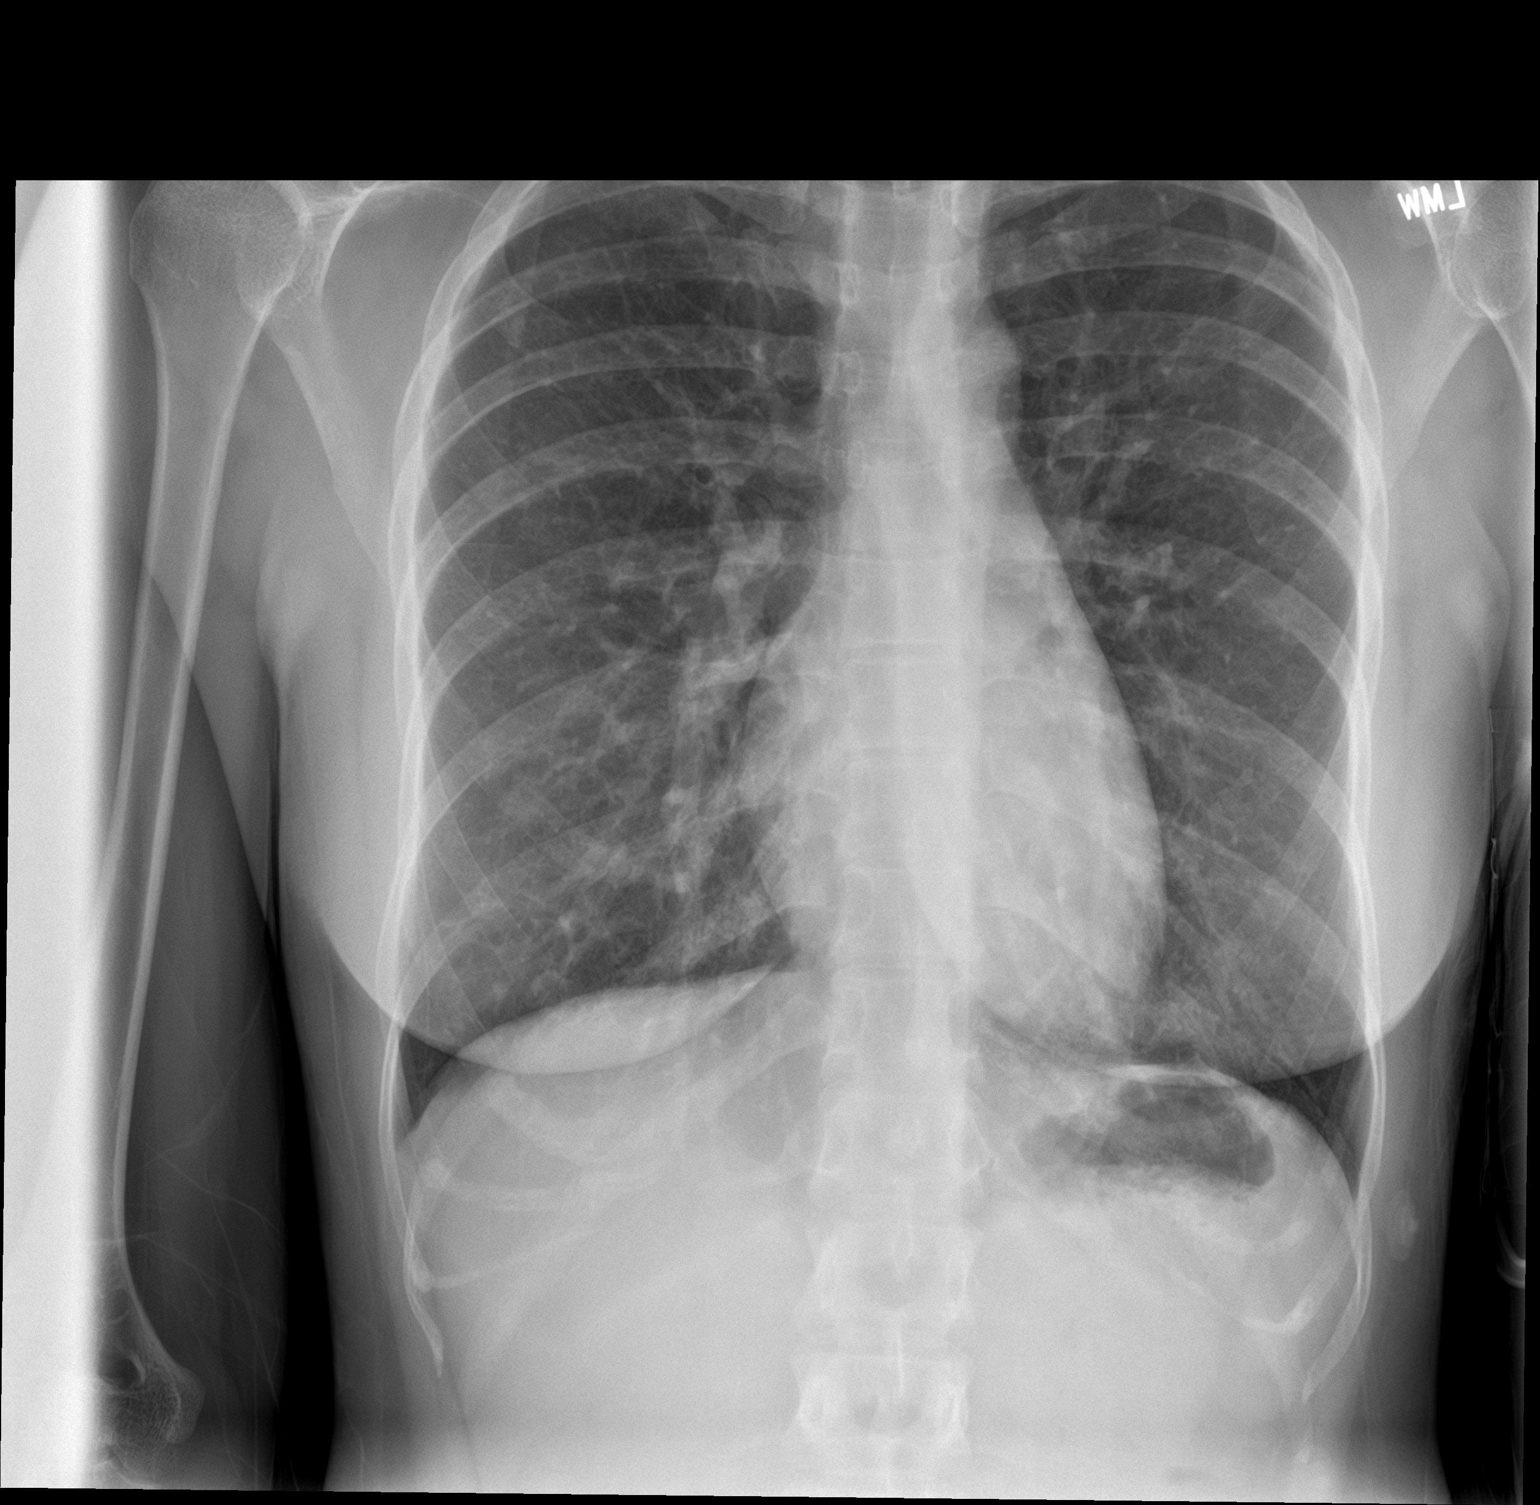

[chest lat]
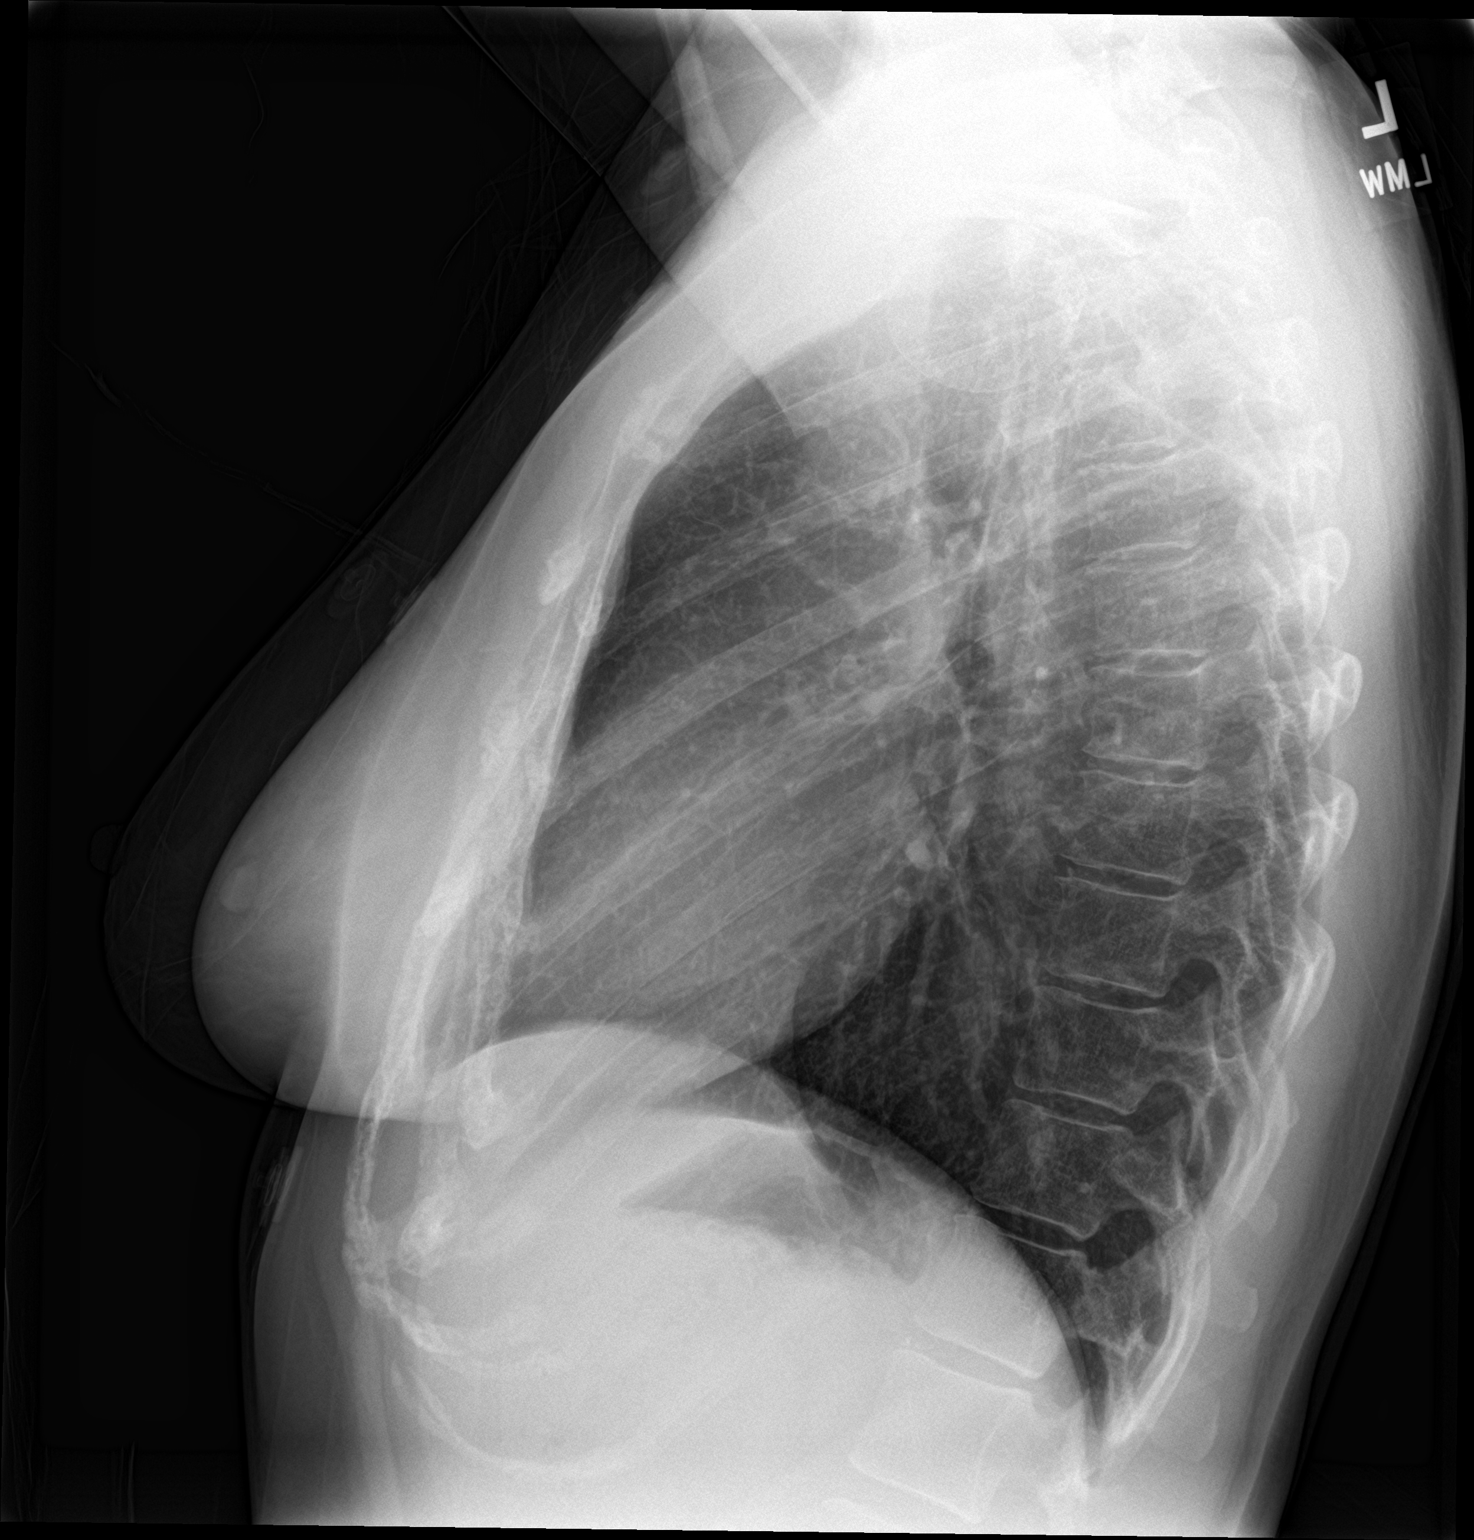

[2 of 2 positions shown; findings below may reference images not displayed]

FINDINGS: No consolidation. No pleural effusion. Normal cardiomediastinal
silhouette. No pneumothorax
IMPRESSION: No active cardiopulmonary disease.

## 2023-06-17 ENCOUNTER — Ambulatory Visit
Admission: EM | Admit: 2023-06-17 | Discharge: 2023-06-17 | Disposition: A | Discharge status: Home / Self Care | Payer: 59 | Attending: Emergency Medicine | Admitting: Emergency Medicine

## 2023-06-17 DIAGNOSIS — J069 Acute upper respiratory infection, unspecified: Secondary | ICD-10-CM | POA: Diagnosis not present

## 2023-06-17 LAB — POC COVID19/FLU A&B COMBO
Covid Antigen, POC: NEGATIVE
Influenza A Antigen, POC: NEGATIVE
Influenza B Antigen, POC: NEGATIVE

## 2023-06-17 NOTE — ED Provider Notes (Signed)
Renaldo Fiddler    CSN: [id number] Arrival date & time: 06/17/23  1804      History   Chief Complaint Chief Complaint  Patient presents with   URI    HPI Ashlee Gonzales is a 33 y.o. female.  Patient presents with 3-day history of congestion, postnasal drip, sinus pressure, sore throat, cough.  No OTC medications taken today but took Mucinex last night.  No fever, shortness of breath, vomiting, diarrhea.  The history is provided by the patient and medical records.    History reviewed. No pertinent past medical history.  There are no active problems to display for this patient.   History reviewed. No pertinent surgical history.  OB History   No obstetric history on file.      Home Medications    Prior to Admission medications   Medication Sig Start Date End Date Taking? Authorizing Provider  amoxicillin-clavulanate (AUGMENTIN) 875-125 MG tablet Take 1 tablet by mouth every 12 (twelve) hours. Patient not taking: Reported on 06/17/2023 07/12/21   Coralyn Mark, NP  fluconazole (DIFLUCAN) 150 MG tablet Take 1 tablet (150 mg total) by mouth daily. Take on 4/26 if sypmtoms not improved Patient not taking: Reported on 06/17/2023 10/25/17   Joni Reining, PA-C  levonorgestrel [facility]) 20 MCG/24HR IUD 1 each by Intrauterine route once.    [provider]  ondansetron (ZOFRAN) 4 MG tablet Take 1 tablet (4 mg total) by mouth every 8 (eight) hours as needed for nausea or vomiting. 05/13/18   Phineas Semen, MD    Family History History reviewed. No pertinent family history.  Social History Social History   Tobacco Use   Smoking status: Some Days    Types: E-cigarettes   Smokeless tobacco: Never  Vaping Use   Vaping status: Every Day  Substance Use Topics   Alcohol use: Yes    Comment: 3 x per week   Drug use: Not Currently    Types: Marijuana     Allergies   Patient has no known allergies.   Review of Systems Review of Systems   Constitutional:  Negative for chills and fever.  HENT:  Positive for congestion, postnasal drip, sinus pressure and sore throat. Negative for ear pain.   Respiratory:  Positive for cough. Negative for shortness of breath.      Physical Exam Triage Vital Signs ED Triage Vitals  Encounter Vitals Group     BP 06/17/23 1827 128/80     Systolic BP Percentile --      Diastolic BP Percentile --      Pulse Rate 06/17/23 1827 69     Resp 06/17/23 1827 18     Temp 06/17/23 1827 98 F (36.7 C)     Temp src --      SpO2 06/17/23 1827 97 %     Weight --      Height --      Head Circumference --      Peak Flow --      Pain Score 06/17/23 1845 3     Pain Loc --      Pain Education --      Exclude from Growth Chart --    No data found.  Updated Vital Signs BP 128/80   Pulse 69   Temp 98 F (36.7 C)   Resp 18   SpO2 97%   Visual Acuity Right Eye Distance:   Left Eye Distance:   Bilateral Distance:    Right  Eye Near:   Left Eye Near:    Bilateral Near:     Physical Exam Constitutional:      General: She is not in acute distress. HENT:     Right Ear: Tympanic membrane normal.     Left Ear: Tympanic membrane normal.     Nose: Congestion present.     Mouth/Throat:     Mouth: Mucous membranes are moist.     Pharynx: Oropharynx is clear.  Cardiovascular:     Rate and Rhythm: Normal rate and regular rhythm.     Heart sounds: Normal heart sounds.  Pulmonary:     Effort: Pulmonary effort is normal. No respiratory distress.     Breath sounds: Normal breath sounds.  Neurological:     Mental Status: She is alert.      UC Treatments / Results  Labs (all labs ordered are listed, but only abnormal results are displayed) Labs Reviewed  POC COVID19/FLU A&B COMBO    EKG   Radiology No results found.  Procedures Procedures (including critical care time)  Medications Ordered in UC Medications - No data to display  Initial Impression / Assessment and Plan / UC  Course  I have reviewed the triage vital signs and the nursing notes.  Pertinent labs & imaging results that were available during my care of the patient were reviewed by me and considered in my medical decision making (see chart for details).    Viral URI.  Rapid COVID and flu negative.  Discussed symptomatic treatment including Tylenol or ibuprofen as needed for fever or discomfort, plain Mucinex as needed for congestion, rest, hydration.  Instructed patient to follow-up with her PCP if she is not improving.  ED precautions given.  Patient agrees to plan of care.   Final Clinical Impressions(s) / UC Diagnoses   Final diagnoses:  Viral URI     Discharge Instructions      The COVID and flu tests are negative.   Take Tylenol or ibuprofen as needed for fever or discomfort.  Take plain Mucinex as needed for congestion.  Rest and keep yourself hydrated.    Follow-up with your primary care provider if your symptoms are not improving.         ED Prescriptions   None    PDMP not reviewed this encounter.   Mickie Bail, NP 06/17/23 [phone]

## 2023-06-17 NOTE — Discharge Instructions (Signed)
 The COVID and flu tests are negative.   Take Tylenol or ibuprofen as needed for fever or discomfort.  Take plain Mucinex as needed for congestion.  Rest and keep yourself hydrated.    Follow-up with your primary care provider if your symptoms are not improving.

## 2023-06-17 NOTE — ED Triage Notes (Signed)
Patient to Urgent Care with complaints of nasal congestion/ sore throat/ cough/ fatigue. Reports right sided sinus pain.  Symptoms started three days ago. Worse over night/ today. Denies any known fevers.  Taking Mucinex.

## 2024-01-02 ENCOUNTER — Encounter: Payer: Self-pay | Admitting: Emergency Medicine

## 2024-01-02 ENCOUNTER — Ambulatory Visit
Admission: EM | Admit: 2024-01-02 | Discharge: 2024-01-02 | Disposition: A | Discharge status: Home / Self Care | Attending: Emergency Medicine | Admitting: Emergency Medicine

## 2024-01-02 DIAGNOSIS — B349 Viral infection, unspecified: Secondary | ICD-10-CM

## 2024-01-02 MED ORDER — PREDNISONE 10 MG (21) PO TBPK: ORAL_TABLET | Freq: Every day | ORAL | 0 refills | Status: AC

## 2024-01-02 MED ORDER — BENZONATATE 100 MG PO CAPS
100.0000 mg | ORAL_CAPSULE | Freq: Three times a day (TID) | ORAL | 0 refills | Status: AC
Start: 2024-01-02 — End: ?

## 2024-01-02 MED ORDER — PROMETHAZINE-DM 6.25-15 MG/5ML PO SYRP: 5.0000 mL | ORAL_SOLUTION | Freq: Every evening | ORAL | 0 refills | Status: AC | PRN

## 2024-01-02 NOTE — Discharge Instructions (Signed)
 Your symptoms today are most likely being caused by a virus and should steadily improve in time it can take up to 7 to 10 days before you truly start to see a turnaround however things will get better  On exam your lungs are clear and you are getting enough air without assistance, able to press on your chest and reproduce your pain which is reassuring that this is most likely muscular due to your persistent coughing  You may use Tessalon  pill every 8 hours as needed to help with cough and you may use cough syrup at bedtime to allow for rest  Starting tomorrow take prednisone  every morning with food to help reduce muscular inflammation, this will also help with sinus pressure and make it easier for you to breathe, avoid use of ibuprofen  during this treatment but you may take Tylenol  if needed       For cough: honey 1/2 to 1 teaspoon (you can dilute the honey in water or another fluid).  You can also use guaifenesin and dextromethorphan for cough. You can use a humidifier for chest congestion and cough.  If you don't have a humidifier, you can sit in the bathroom with the hot shower running.      For sore throat: try warm salt water gargles, cepacol lozenges, throat spray, warm tea or water with lemon/honey, popsicles or ice, or OTC cold relief medicine for throat discomfort.   For congestion: take a daily anti-histamine like Zyrtec, Claritin, and a oral decongestant, such as pseudoephedrine.  You can also use Flonase 1-2 sprays in each nostril daily.   It is important to stay hydrated: drink plenty of fluids (water, gatorade/powerade/pedialyte, juices, or teas) to keep your throat moisturized and help further relieve irritation/discomfort.

## 2024-01-02 NOTE — ED Triage Notes (Signed)
 Patient has cough, sore throat, chest congestion  and chest tightness x 2 days. Patient took home Covid test results negative. Has taken Alka-Seltzer Plus.

## 2024-01-02 NOTE — ED Provider Notes (Signed)
 Ashlee Gonzales    CSN: [id number] Arrival date & time: 01/02/24  1904      History   Chief Complaint Chief Complaint  Patient presents with   Cough   Sore Throat    HPI Ashlee Gonzales is a 33 y.o. female.   Patient presents for evaluation of a nonproductive cough, itchy throat, sinus pressure underneath the eyes, centralized chest pain and shortness of breath only with coughing present for 2 days.  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  Decreased appetite but tolerable to food and liquids.  Has attempted use of Alka-Seltzer cold and flu mixture.  Denies fever, wheezing.  Denies respiratory history, non-smoker.  History reviewed. No pertinent past medical history.  There are no active problems to display for this patient.   History reviewed. No pertinent surgical history.  OB History   No obstetric history on file.      Home Medications    Prior to Admission medications   Medication Sig Start Date End Date Taking? Authorizing Provider  benzonatate  (TESSALON ) 100 MG capsule Take 1 capsule (100 mg total) by mouth every 8 (eight) hours. 01/02/24  Yes Marvion Bastidas R, NP  predniSONE  (STERAPRED UNI-PAK 21 TAB) 10 MG (21) TBPK tablet Take by mouth daily. Take 6 tabs by mouth daily  for 1 days, then 5 tabs for 1 days, then 4 tabs for 1 days, then 3 tabs for 1 days, 2 tabs for 1 days, then 1 tab by mouth daily for 1 days 01/02/24  Yes Elena Davia R, NP  promethazine -dextromethorphan (PROMETHAZINE -DM) 6.25-15 MG/5ML syrup Take 5 mLs by mouth at bedtime as needed. 01/02/24  Yes Jalacia Mattila R, NP  amoxicillin -clavulanate (AUGMENTIN ) 875-125 MG tablet Take 1 tablet by mouth every 12 (twelve) hours. Patient not taking: Reported on 06/17/2023 07/12/21   Merilee Andrea CROME, NP  fluconazole  (DIFLUCAN ) 150 MG tablet Take 1 tablet (150 mg total) by mouth daily. Take on 4/26 if sypmtoms not improved Patient not taking: Reported on 06/17/2023 10/25/17   Claudene Tanda POUR, PA-C  levonorgestrel (MIRENA) 20 MCG/24HR IUD 1 each by Intrauterine route once.    [provider]  ondansetron  (ZOFRAN ) 4 MG tablet Take 1 tablet (4 mg total) by mouth every 8 (eight) hours as needed for nausea or vomiting. 05/13/18   Floy Roberts, MD    Family History History reviewed. No pertinent family history.  Social History Social History   Tobacco Use   Smoking status: Some Days    Types: E-cigarettes   Smokeless tobacco: Never  Vaping Use   Vaping status: Every Day  Substance Use Topics   Alcohol use: Yes    Comment: 3 x per week   Drug use: Not Currently    Types: Marijuana     Allergies   Patient has no known allergies.   Review of Systems Review of Systems  Respiratory:  Positive for cough.      Physical Exam Triage Vital Signs ED Triage Vitals [01/02/24 1917]  Encounter Vitals Group     BP      Girls Systolic BP Percentile      Girls Diastolic BP Percentile      Boys Systolic BP Percentile      Boys Diastolic BP Percentile      Pulse      Resp      Temp      Temp src      SpO2      Weight  Height      Head Circumference      Peak Flow      Pain Score 5     Pain Loc      Pain Education      Exclude from Growth Chart    No data found.  Updated Vital Signs There were no vitals taken for this visit.  Visual Acuity Right Eye Distance:   Left Eye Distance:   Bilateral Distance:    Right Eye Near:   Left Eye Near:    Bilateral Near:     Physical Exam Constitutional:      Appearance: Normal appearance.  HENT:     Head: Normocephalic.     Right Ear: Tympanic membrane, ear canal and external ear normal.     Left Ear: Tympanic membrane, ear canal and external ear normal.     Nose: Congestion present.     Mouth/Throat:     Pharynx: Posterior oropharyngeal erythema present. No oropharyngeal exudate.  Eyes:     Extraocular Movements: Extraocular movements intact.  Cardiovascular:     Rate and Rhythm:  Normal rate and regular rhythm.     Pulses: Normal pulses.     Heart sounds: Normal heart sounds.  Pulmonary:     Effort: Pulmonary effort is normal.     Breath sounds: Normal breath sounds.  Chest:     Comments: Tenderness is present to the center of the chest without ecchymosis swelling or deformity, chest wall is symmetrical Musculoskeletal:     Cervical back: Normal range of motion and neck supple.  Neurological:     Mental Status: She is alert and oriented to person, place, and time. Mental status is at baseline.      UC Treatments / Results  Labs (all labs ordered are listed, but only abnormal results are displayed) Labs Reviewed  POC SOFIA SARS ANTIGEN FIA    EKG   Radiology No results found.  Procedures Procedures (including critical care time)  Medications Ordered in UC Medications - No data to display  Initial Impression / Assessment and Plan / UC Course  I have reviewed the triage vital signs and the nursing notes.  Pertinent labs & imaging results that were available during my care of the patient were reviewed by me and considered in my medical decision making (see chart for details).  Viral illness  Patient is in no signs of distress nor toxic appearing.  Vital signs are stable.  Low suspicion for pneumonia, pneumothorax or bronchitis and therefore will defer imaging.  COVID test negative.  Etiology most likely viral, chest wall reproducible most likely muscular low suspicion for respiratory involvement, and no signs of distress or toxic appearing, stable.  Prescribed prednisone  Tessalon  and Promethazine  DM. May use additional over-the-counter medications as needed for supportive care.  May follow-up with urgent care as needed if symptoms persist or worsen.  Note given.   Final Clinical Impressions(s) / UC Diagnoses   Final diagnoses:  Viral illness     Discharge Instructions      Your symptoms today are most likely being caused by a virus and  should steadily improve in time it can take up to 7 to 10 days before you truly start to see a turnaround however things will get better  On exam your lungs are clear and you are getting enough air without assistance, able to press on your chest and reproduce your pain which is reassuring that this is most likely muscular due to your  persistent coughing  You may use Tessalon  pill every 8 hours as needed to help with cough and you may use cough syrup at bedtime to allow for rest  Starting tomorrow take prednisone  every morning with food to help reduce muscular inflammation, this will also help with sinus pressure and make it easier for you to breathe, avoid use of ibuprofen  during this treatment but you may take Tylenol  if needed       For cough: honey 1/2 to 1 teaspoon (you can dilute the honey in water or another fluid).  You can also use guaifenesin and dextromethorphan for cough. You can use a humidifier for chest congestion and cough.  If you don't have a humidifier, you can sit in the bathroom with the hot shower running.      For sore throat: try warm salt water gargles, cepacol lozenges, throat spray, warm tea or water with lemon/honey, popsicles or ice, or OTC cold relief medicine for throat discomfort.   For congestion: take a daily anti-histamine like Zyrtec, Claritin, and a oral decongestant, such as pseudoephedrine.  You can also use Flonase 1-2 sprays in each nostril daily.   It is important to stay hydrated: drink plenty of fluids (water, gatorade/powerade/pedialyte, juices, or teas) to keep your throat moisturized and help further relieve irritation/discomfort.     ED Prescriptions     Medication Sig Dispense Auth. Provider   benzonatate  (TESSALON ) 100 MG capsule Take 1 capsule (100 mg total) by mouth every 8 (eight) hours. 21 capsule Macaria Bias R, NP   promethazine -dextromethorphan (PROMETHAZINE -DM) 6.25-15 MG/5ML syrup Take 5 mLs by mouth at bedtime as needed. 118 mL  Akeira Lahm R, NP   predniSONE  (STERAPRED UNI-PAK 21 TAB) 10 MG (21) TBPK tablet Take by mouth daily. Take 6 tabs by mouth daily  for 1 days, then 5 tabs for 1 days, then 4 tabs for 1 days, then 3 tabs for 1 days, 2 tabs for 1 days, then 1 tab by mouth daily for 1 days 21 tablet Emberlin Verner, Shelba SAUNDERS, NP      PDMP not reviewed this encounter.   Teresa Shelba SAUNDERS, [place] 01/02/24 1931
